# Patient Record
Sex: Male | Born: 1965 | Race: White | Hispanic: No | Marital: Married | Smoking: Never smoker
Health system: Southern US, Community
[De-identification: ages and names within clinical notes are randomized; demographics above are authoritative.]

## PROBLEM LIST (undated history)

## (undated) DIAGNOSIS — I10 Essential (primary) hypertension: Secondary | ICD-10-CM

---

## 2016-10-22 ENCOUNTER — Ambulatory Visit
Admission: EM | Admit: 2016-10-22 | Discharge: 2016-10-22 | Disposition: A | Discharge status: Home / Self Care | Payer: BLUE CROSS/BLUE SHIELD | Attending: Emergency Medicine | Admitting: Emergency Medicine

## 2016-10-22 DIAGNOSIS — H6120 Impacted cerumen, unspecified ear: Secondary | ICD-10-CM

## 2016-10-22 DIAGNOSIS — H6123 Impacted cerumen, bilateral: Secondary | ICD-10-CM | POA: Diagnosis not present

## 2016-10-22 HISTORY — DX: Essential (primary) hypertension: I10

## 2016-10-22 NOTE — ED Triage Notes (Signed)
Pt reports left ear stopped up with wax. Seen by PCP last month who offered to clean it out but he declined at that time. Now wants ear cleaned. Denies pain.

## 2016-10-22 NOTE — ED Provider Notes (Signed)
HPI  SUBJECTIVE:  Kenneth Kelley is a 50 y.o. male who presents with increased wax buildup in his left ear for the past month. He denies pain, otorrhea, fevers, change in hearing. His PMD offered to wash out 1 month ago, but he declined. He tried unknown drop last night which made his symptoms worse. No alleviating factors. He has a past medical history of hypertension. PMD: Dr. Annalee GentaSantityano [facility]      Past Medical History:  Diagnosis Date  . Hypertension     History reviewed. No pertinent surgical history.  History reviewed. No pertinent family history.  Social History  Substance Use Topics  . Smoking status: Never Smoker  . Smokeless tobacco: Never Used  . Alcohol use Yes     Comment: social    No current facility-administered medications for this encounter.   Current Outpatient Prescriptions:  .  lisinopril (PRINIVIL,ZESTRIL) 20 MG tablet, Take 20 mg by mouth daily., Disp: , Rfl:   No Known Allergies   ROS  As noted in HPI.   Physical Exam  BP 125/78 (BP Location: Left Arm)   Pulse 85   Temp 98.3 F (36.8 C) (Oral)   Resp 18   Ht 5\' 8"  (1.727 m)   Wt 188 lb (85.3 kg)   SpO2 100%   BMI 28.59 kg/m   Constitutional: Well developed, well nourished, no acute distress Eyes:  EOMI, conjunctiva normal bilaterally HENT: Normocephalic, atraumatic,mucus membranes moist. External ear canals clear and normal bilaterally, TMs normal bilaterally. No evidence of infection bilaterally. Respiratory: Normal inspiratory effort Cardiovascular: Normal rate GI: nondistended skin: No rash, skin intact Musculoskeletal: no deformities Neurologic: Alert & oriented x 3, no focal neuro deficits Psychiatric: Speech and behavior appropriate   ED Course   Medications - No data to display  No orders of the defined types were placed in this encounter.   No results found for this or any previous visit (from the past 24 hour(s)). No results found.  ED  Clinical Impression  Impacted cerumen, unspecified laterality   ED Assessment/Plan  ears were irrigated by RN prior to my examination with complete resolution of symptoms. Advised patient on prevention. He'll follow) care physician as needed.  Meds ordered this encounter  Medications  . lisinopril (PRINIVIL,ZESTRIL) 20 MG tablet    Sig: Take 20 mg by mouth daily.    *This clinic note was created using Dragon dictation software. Therefore, there may be occasional mistakes despite careful proofreading.  ?   Domenick GongAshley Annabel Gibeau, MD 10/22/16 1007

## 2016-10-22 NOTE — ED Notes (Signed)
Cerumen disimpaction performed with Elephant Ear irrigation using warm water and Peroxide. Pt agrees to both ears being cleaned. Wax removal completed both ears. Able to visualize tympanic membrane post-irrigation

## 2016-10-22 NOTE — ED Notes (Signed)
Pt requests only left ear be irrigated today. Both appear impacted with cerumen.

## 2019-11-30 ENCOUNTER — Ambulatory Visit (INDEPENDENT_AMBULATORY_CARE_PROVIDER_SITE_OTHER): Payer: BC Managed Care – PPO

## 2019-11-30 ENCOUNTER — Ambulatory Visit
Admission: EM | Admit: 2019-11-30 | Discharge: 2019-11-30 | Disposition: A | Discharge status: Home / Self Care | Payer: BC Managed Care – PPO | Attending: Family Medicine | Admitting: Family Medicine

## 2019-11-30 ENCOUNTER — Other Ambulatory Visit: Payer: Self-pay

## 2019-11-30 ENCOUNTER — Encounter: Payer: Self-pay | Admitting: Emergency Medicine

## 2019-11-30 DIAGNOSIS — M25511 Pain in right shoulder: Secondary | ICD-10-CM

## 2019-11-30 DIAGNOSIS — S46011A Strain of muscle(s) and tendon(s) of the rotator cuff of right shoulder, initial encounter: Secondary | ICD-10-CM

## 2019-11-30 MED ORDER — MELOXICAM 15 MG PO TABS: 15.0000 mg | ORAL_TABLET | Freq: Every day | ORAL | 0 refills | Status: AC | PRN

## 2019-11-30 NOTE — ED Triage Notes (Signed)
Patient was on a treadmill and he fell on his right shoulder this afternoon. He is c/o right shoulder pain.

## 2019-11-30 NOTE — ED Provider Notes (Signed)
[facility]    CSN: [id number] Arrival date & time: 11/30/19  1634  History   Chief Complaint Chief Complaint  Patient presents with  . Shoulder Pain   HPI  54 year old male presents with shoulder pain.  Right shoulder pain  Patient fell while running on a treadmill today.  Fell on his right shoulder.   Pain - 5/10 in severity.  Full ROM.  No known exacerbating or relieving factors.  No other reported injuries.  No medications/interventions tried.  PMH, Surgical Hx, Family Hx, Social History reviewed and updated as below.  Past Medical History:  Diagnosis Date  . Hypertension    History reviewed. No pertinent surgical history.  Home Medications    Prior to Admission medications   Medication Sig Start Date End Date Taking? Authorizing Provider  lisinopril (PRINIVIL,ZESTRIL) 20 MG tablet Take 20 mg by mouth daily.   Yes [provider]  meloxicam (MOBIC) 15 MG tablet Take 1 tablet (15 mg total) by mouth daily as needed for pain. 11/30/19   Tommie Sams, DO    Family History Heart disease Father    Alzheimer's disease Maternal Grandmother    High blood pressure (Hypertension) Mother    No Known Problems Sister    No Known Problems Sister    Hunter's disease Son      Social History Social History   Tobacco Use  . Smoking status: Never Smoker  . Smokeless tobacco: Never Used  Substance Use Topics  . Alcohol use: Yes    Comment: social  . Drug use: No     Allergies   Patient has no known allergies.   Review of Systems Review of Systems  Constitutional: Negative.   Musculoskeletal:       Right shoulder pain.   Physical Exam Triage Vital Signs ED Triage Vitals  Enc Vitals Group     BP 11/30/19 1706 (!) 154/80     Pulse Rate 11/30/19 1706 100     Resp 11/30/19 1706 18     Temp 11/30/19 1706 98.4 F (36.9 C)     Temp Source 11/30/19 1706 Oral     SpO2 --      Weight 11/30/19 1704 192 lb (87.1 kg)     Height  11/30/19 1704 5\' 8"  (1.727 m)     Head Circumference --      Peak Flow --      Pain Score 11/30/19 1704 5     Pain Loc --      Pain Edu? --      Excl. in GC? --    Updated Vital Signs BP (!) 154/80 (BP Location: Right Arm)   Pulse 100   Temp 98.4 F (36.9 C) (Oral)   Resp 18   Ht 5\' 8"  (1.727 m)   Wt 87.1 kg   BMI 29.19 kg/m   Visual Acuity Right Eye Distance:   Left Eye Distance:   Bilateral Distance:    Right Eye Near:   Left Eye Near:    Bilateral Near:     Physical Exam Vitals and nursing note reviewed.  Constitutional:      General: He is not in acute distress.    Appearance: Normal appearance. He is not ill-appearing.  HENT:     Head: Normocephalic and atraumatic.  Eyes:     General:        Right eye: No discharge.        Left eye: No discharge.  Conjunctiva/sclera: Conjunctivae normal.  Cardiovascular:     Rate and Rhythm: Regular rhythm. Tachycardia present.     Heart sounds: No murmur.  Pulmonary:     Effort: Pulmonary effort is normal.     Breath sounds: Normal breath sounds. No rhonchi or rales.  Musculoskeletal:     Comments: Shoulder: Right  Inspection reveals no abnormalities, atrophy or asymmetry.  Palpation - no significant tenderness on exam. ROM is full in all planes. Rotator cuff strength - Supraspinatus and Infraspinatus 5/5. Infraspinatus and teres minor - cannot provide any force against resistance.  Negative Empty can and Hawkins test.  Neurological:     Mental Status: He is alert.  Psychiatric:        Mood and Affect: Mood normal.        Behavior: Behavior normal.    UC Treatments / Results  Labs (all labs ordered are listed, but only abnormal results are displayed) Labs Reviewed - No data to display  EKG   Radiology DG Shoulder Right  Result Date: 11/30/2019 CLINICAL DATA:  Right shoulder pain after fall. EXAM: RIGHT SHOULDER - 2+ VIEW COMPARISON:  None. FINDINGS: No acute fracture or dislocation. Mild glenohumeral and  acromioclavicular joint marginal spurring. Bone mineralization is normal. Soft tissues are unremarkable. IMPRESSION: 1.  No acute osseous abnormality. 2. Mild acromioclavicular and glenohumeral osteoarthritis. Electronically Signed   By: Titus Dubin M.D.   On: 11/30/2019 17:43    Procedures Procedures (including critical care time)  Medications Ordered in UC Medications - No data to display  Initial Impression / Assessment and Plan / UC Course  I have reviewed the triage vital signs and the nursing notes.  Pertinent labs & imaging results that were available during my care of the patient were reviewed by me and considered in my medical decision making (see chart for details).    54 year old male presents with a suspected rotator cuff tear. Starting mobic. Advised to see Ortho ([place] or [facility]).   Final Clinical Impressions(s) / UC Diagnoses   Final diagnoses:  Traumatic tear of right rotator cuff, unspecified tear extent, initial encounter     Discharge Instructions     Call [facility] clinic for an appt. You need to see Dr. Posey Pronto or [facility] (concern for rotator cuff tear).  Medication as prescribed.  Take care  Dr. Lacinda Axon    ED Prescriptions    Medication Sig Dispense Auth. Provider   meloxicam (MOBIC) 15 MG tablet Take 1 tablet (15 mg total) by mouth daily as needed for pain. 30 tablet Coral Spikes, DO     PDMP not reviewed this encounter.   Coral Spikes, [place] 11/30/19 2156

## 2019-11-30 NOTE — Discharge Instructions (Signed)
Call [facility] clinic for an appt. You need to see Dr. Allena Katz or [facility] (concern for rotator cuff tear).  Medication as prescribed.  Take care  Dr. Adriana Simas

## 2019-12-23 ENCOUNTER — Other Ambulatory Visit: Payer: Self-pay | Admitting: Family Medicine

## 2021-08-07 IMAGING — CR DG SHOULDER 2+V*R*
3 series · 3 of 3 positions shown · non-contrast
Comparison: None.

CLINICAL DATA: Right shoulder pain after fall.

EXAM:
RIGHT SHOULDER - 2+ VIEW

[shoulder grashey]
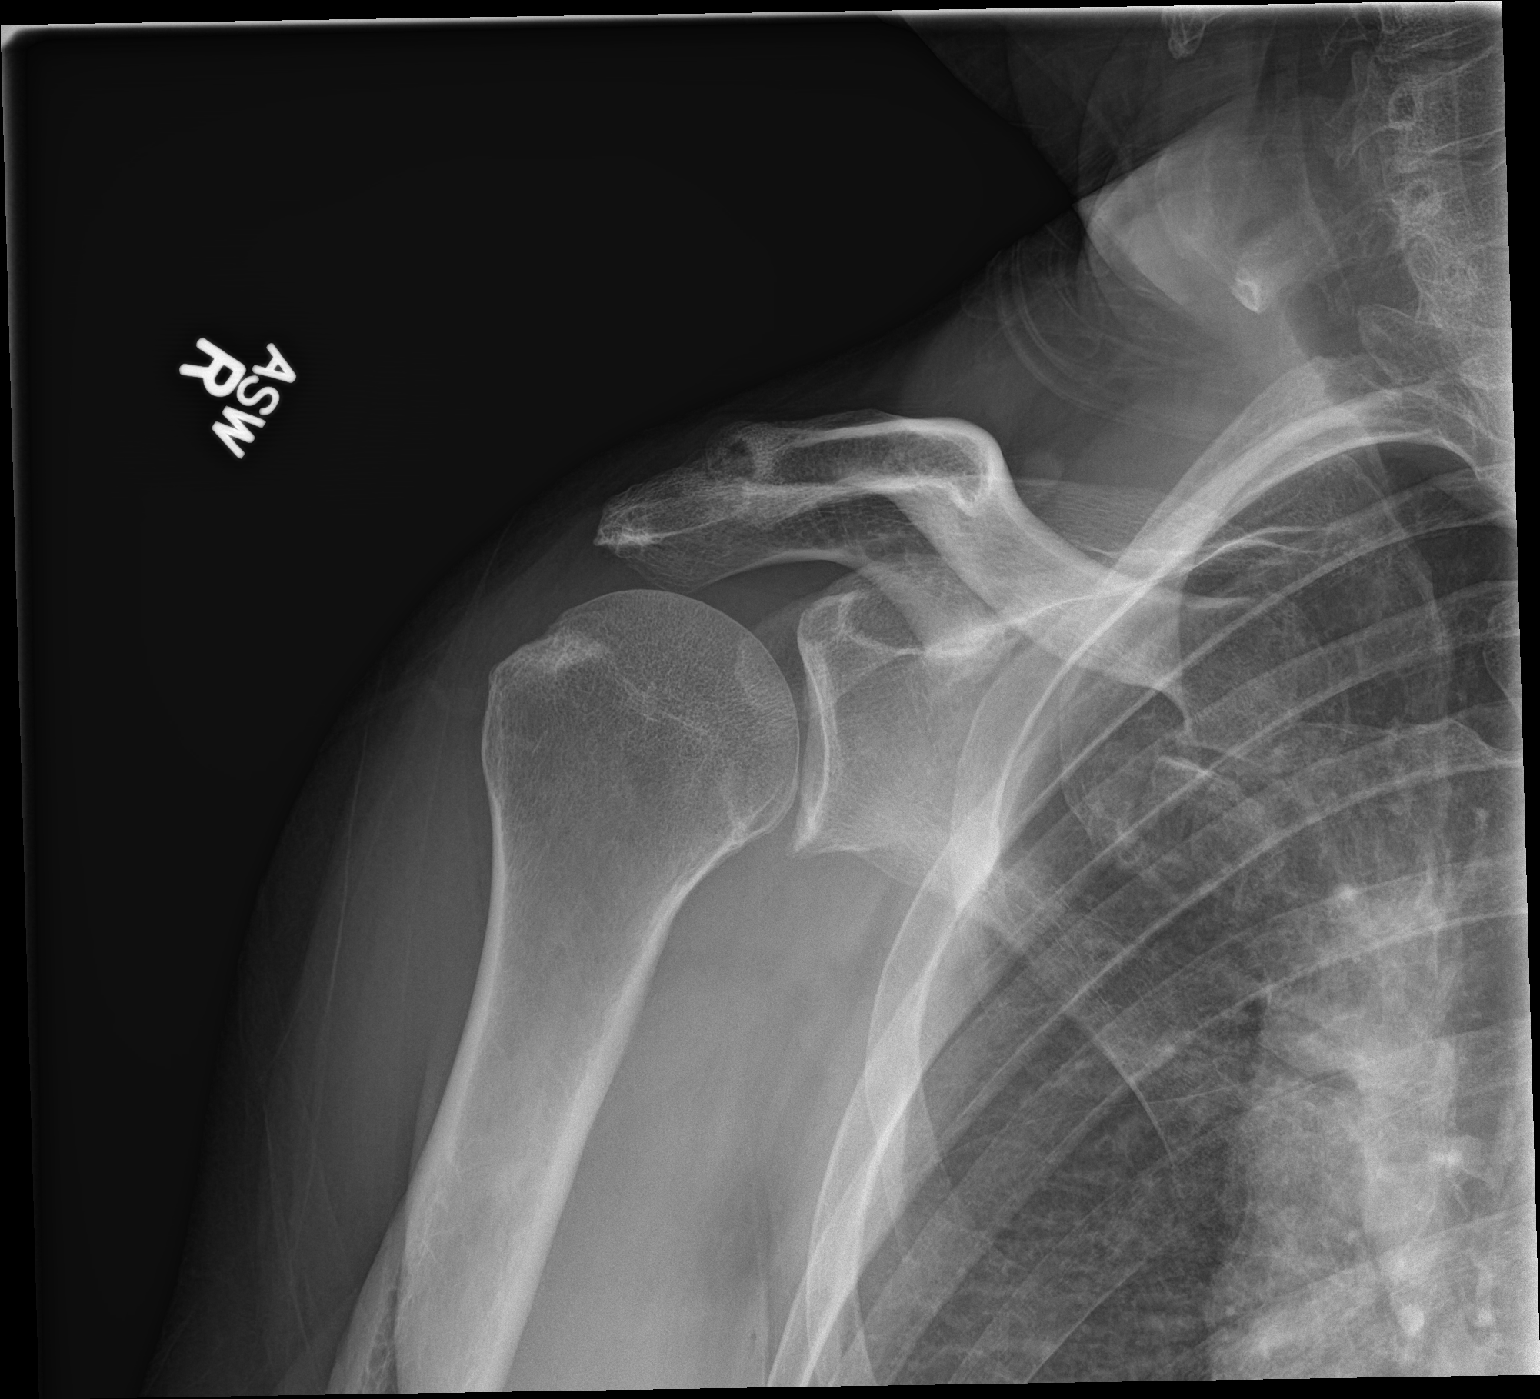

[shoulder y view]
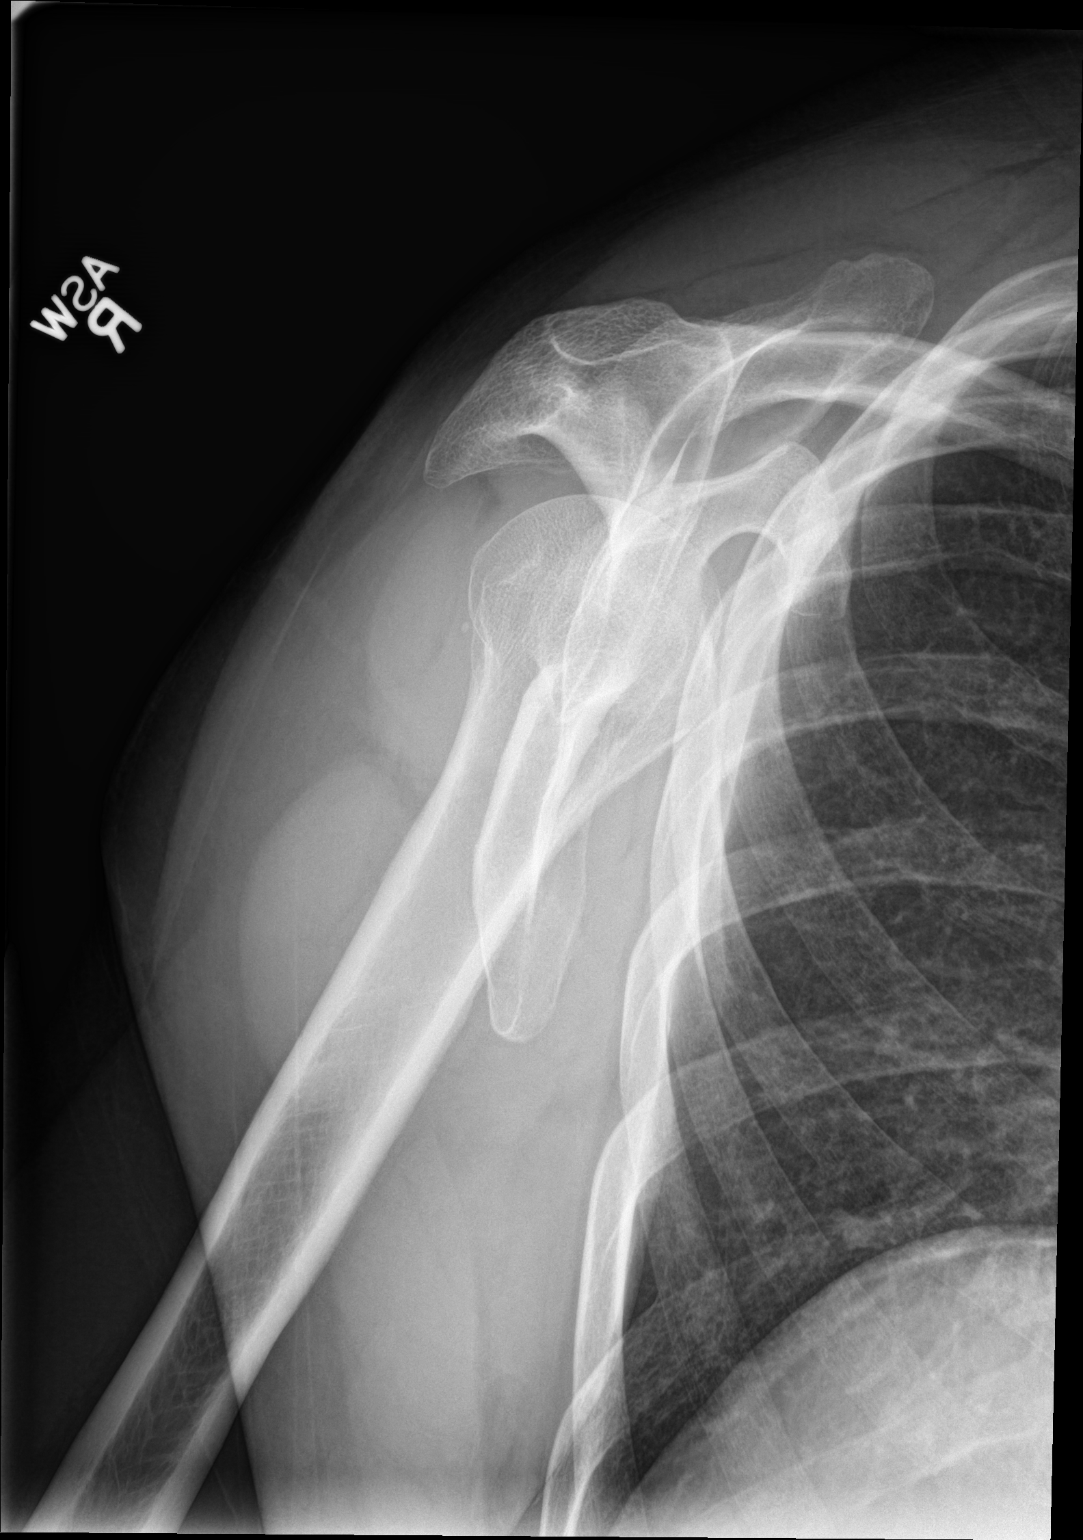

[shoulder axial]
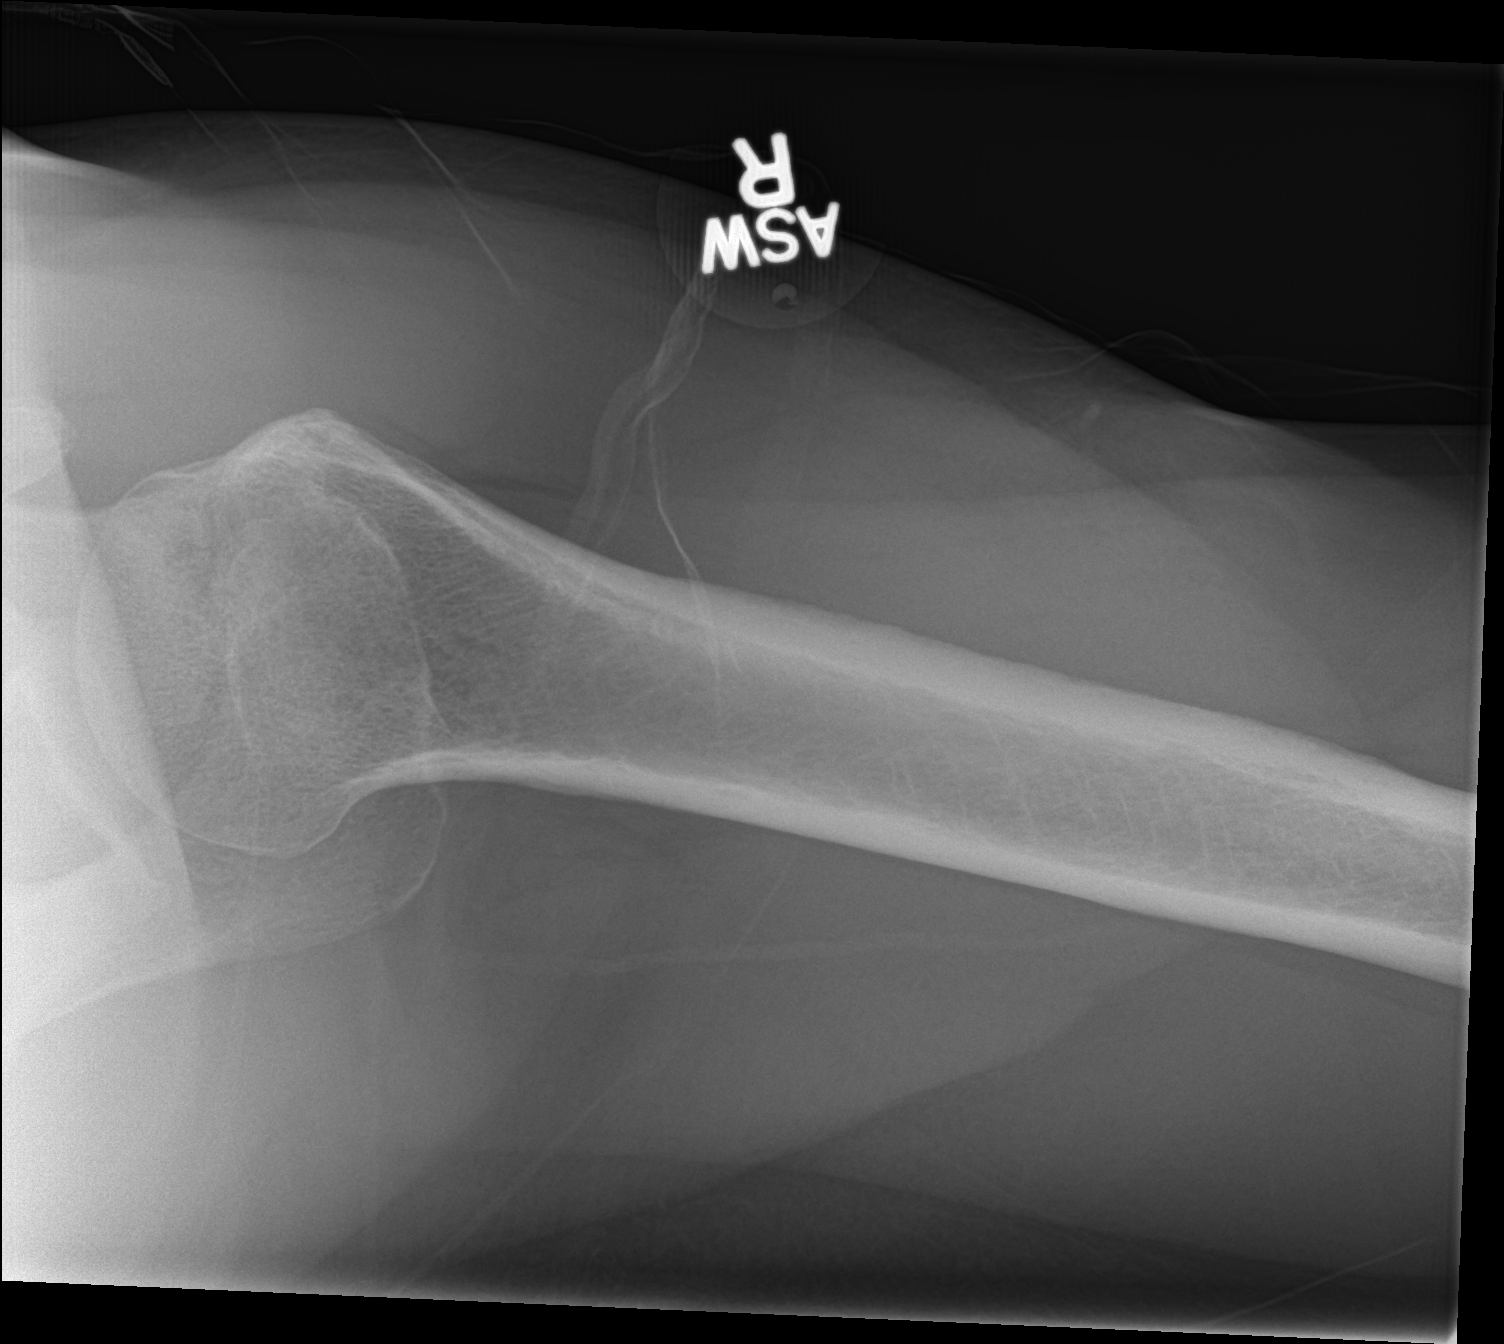

[3 of 3 positions shown; findings below may reference images not displayed]

FINDINGS: No acute fracture or dislocation. Mild glenohumeral and
acromioclavicular joint marginal spurring. Bone mineralization is
normal. Soft tissues are unremarkable.
IMPRESSION: 1.  No acute osseous abnormality.
2. Mild acromioclavicular and glenohumeral osteoarthritis.
